# Patient Record
Sex: Female | Born: 1975 | Race: Black or African American | Hispanic: No | Marital: Single | Smoking: Never smoker
Health system: Southern US, Community
[De-identification: ages and names within clinical notes are randomized; demographics above are authoritative.]

## PROBLEM LIST (undated history)

## (undated) DIAGNOSIS — J45909 Unspecified asthma, uncomplicated: Secondary | ICD-10-CM

## (undated) HISTORY — PX: KNEE SURGERY: SHX244

---

## 1998-02-11 ENCOUNTER — Ambulatory Visit (HOSPITAL_COMMUNITY): Admission: RE | Admit: 1998-02-11 | Discharge: 1998-02-11 | Payer: Self-pay | Admitting: Obstetrics and Gynecology

## 1998-02-11 ENCOUNTER — Encounter: Payer: Self-pay | Admitting: Obstetrics and Gynecology

## 1998-02-25 ENCOUNTER — Other Ambulatory Visit: Admission: RE | Admit: 1998-02-25 | Discharge: 1998-02-25 | Payer: Self-pay | Admitting: Obstetrics and Gynecology

## 1999-06-23 ENCOUNTER — Other Ambulatory Visit: Admission: RE | Admit: 1999-06-23 | Discharge: 1999-06-23 | Payer: Self-pay | Admitting: Obstetrics and Gynecology

## 2002-05-04 ENCOUNTER — Other Ambulatory Visit: Admission: RE | Admit: 2002-05-04 | Discharge: 2002-05-04 | Payer: Self-pay | Admitting: *Deleted

## 2004-07-18 ENCOUNTER — Ambulatory Visit: Payer: Self-pay | Admitting: Internal Medicine

## 2004-07-23 ENCOUNTER — Ambulatory Visit: Payer: Self-pay | Admitting: Cardiology

## 2005-10-23 ENCOUNTER — Other Ambulatory Visit: Admission: RE | Admit: 2005-10-23 | Discharge: 2005-10-23 | Payer: Self-pay | Admitting: Family Medicine

## 2006-10-26 ENCOUNTER — Other Ambulatory Visit: Admission: RE | Admit: 2006-10-26 | Discharge: 2006-10-26 | Payer: Self-pay | Admitting: Family Medicine

## 2006-11-25 ENCOUNTER — Encounter: Admission: RE | Admit: 2006-11-25 | Discharge: 2007-02-23 | Payer: Self-pay | Admitting: Family Medicine

## 2010-04-23 ENCOUNTER — Other Ambulatory Visit (HOSPITAL_COMMUNITY)
Admission: RE | Admit: 2010-04-23 | Discharge: 2010-04-23 | Disposition: A | Discharge status: Home / Self Care | Payer: BC Managed Care – PPO | Source: Ambulatory Visit | Attending: Family Medicine | Admitting: Family Medicine

## 2010-04-23 ENCOUNTER — Other Ambulatory Visit: Payer: Self-pay | Admitting: Family Medicine

## 2010-04-23 DIAGNOSIS — Z124 Encounter for screening for malignant neoplasm of cervix: Secondary | ICD-10-CM | POA: Insufficient documentation

## 2012-09-12 ENCOUNTER — Other Ambulatory Visit (HOSPITAL_COMMUNITY)
Admission: RE | Admit: 2012-09-12 | Discharge: 2012-09-12 | Disposition: A | Discharge status: Home / Self Care | Payer: BC Managed Care – PPO | Source: Ambulatory Visit | Attending: Family Medicine | Admitting: Family Medicine

## 2012-09-12 ENCOUNTER — Other Ambulatory Visit: Payer: Self-pay | Admitting: Family Medicine

## 2012-09-12 DIAGNOSIS — Z1151 Encounter for screening for human papillomavirus (HPV): Secondary | ICD-10-CM | POA: Insufficient documentation

## 2012-09-12 DIAGNOSIS — Z124 Encounter for screening for malignant neoplasm of cervix: Secondary | ICD-10-CM | POA: Insufficient documentation

## 2015-09-26 ENCOUNTER — Other Ambulatory Visit: Payer: Self-pay | Admitting: Family Medicine

## 2015-09-26 ENCOUNTER — Other Ambulatory Visit (HOSPITAL_COMMUNITY)
Admission: RE | Admit: 2015-09-26 | Discharge: 2015-09-26 | Disposition: A | Discharge status: Home / Self Care | Payer: BC Managed Care – PPO | Source: Ambulatory Visit | Attending: Family Medicine | Admitting: Family Medicine

## 2015-09-26 DIAGNOSIS — Z113 Encounter for screening for infections with a predominantly sexual mode of transmission: Secondary | ICD-10-CM | POA: Diagnosis present

## 2015-09-26 DIAGNOSIS — Z1151 Encounter for screening for human papillomavirus (HPV): Secondary | ICD-10-CM | POA: Insufficient documentation

## 2015-09-26 DIAGNOSIS — Z124 Encounter for screening for malignant neoplasm of cervix: Secondary | ICD-10-CM | POA: Insufficient documentation

## 2015-09-27 LAB — CYTOLOGY - PAP

## 2016-06-24 ENCOUNTER — Other Ambulatory Visit: Payer: Self-pay | Admitting: Obstetrics and Gynecology

## 2019-05-25 ENCOUNTER — Encounter: Payer: Self-pay | Admitting: Physician Assistant

## 2019-05-25 ENCOUNTER — Ambulatory Visit: Payer: BC Managed Care – PPO | Admitting: Physician Assistant

## 2019-05-25 ENCOUNTER — Other Ambulatory Visit: Payer: Self-pay

## 2019-05-25 DIAGNOSIS — L739 Follicular disorder, unspecified: Secondary | ICD-10-CM | POA: Diagnosis not present

## 2019-05-25 MED ORDER — DOXYCYCLINE HYCLATE 100 MG PO CAPS: 100.0000 mg | ORAL_CAPSULE | Freq: Two times a day (BID) | ORAL | 2 refills | Status: AC

## 2019-05-25 MED ORDER — CLOBETASOL PROPIONATE 0.05 % EX SOLN: 1.0000 "application " | Freq: Two times a day (BID) | CUTANEOUS | 2 refills | Status: AC

## 2019-05-25 NOTE — Progress Notes (Signed)
   New Patient   Subjective  Sherry Mckee is an AA 44 y.o. female who presents for the following: Skin Problem (Patient has some breaking out of face. Started using a hair vitamin the mane choice in jan 2021. She used it a few months and then started breaking out. She stopped vitamin but now she says she cant use anthing on face except water without breaking out.  Also check patients scalp it itches so bad in the frontal area. Hair broke off in the itchy areas. No spots in scalp just red and itchy. Treatments were no more itch spray otc and then itch releif spray. Help some and patients hair is growing back. ).  The following portions of the chart were reviewed this encounter and updated as appropriate: Tobacco  Allergies  Meds  Problems  Med Hx  Surg Hx  Fam Hx      Objective  Well appearing patient in no apparent distress; mood and affect are within normal limits.  A focused examination was performed including scalp. Relevant physical exam findings are noted in the Assessment and Plan.  Objective  Mid Occipital Scalp (2), Mid Parietal Scalp (2), Right Occipital Scalp: Papules with PIH, nodules with loss of hair post occiput from picking  Assessment & Plan  Folliculitis (5) Right Occipital Scalp; Mid Parietal Scalp (2); Mid Occipital Scalp (2)  Bacterial culture Doxycycline , clobetasol cream  Other Related Procedures Anaerobic and Aerobic Culture

## 2019-05-30 ENCOUNTER — Telehealth: Payer: Self-pay

## 2019-05-30 NOTE — Telephone Encounter (Signed)
-----   Message from Kelli R Sheffield, PA-C sent at 05/30/2019  9:10 AM EDT ----- Please call to check status 

## 2019-05-30 NOTE — Telephone Encounter (Signed)
Phone call to patient with her culture results and to check on her per [facility] request.  Patient aware of culture results, patient states that she's doing much better and that the current treatment is helping her a great deal.  Mackey Birchwood aware.

## 2019-05-30 NOTE — Telephone Encounter (Signed)
-----   Message from Glyn Ade, [place] sent at 05/30/2019  9:10 AM EDT ----- Please call to check status

## 2019-06-01 LAB — ANAEROBIC AND AEROBIC CULTURE
MICRO NUMBER:: 10506022
MICRO NUMBER:: 10506023
SPECIMEN QUALITY:: ADEQUATE
SPECIMEN QUALITY:: ADEQUATE

## 2019-06-06 ENCOUNTER — Telehealth: Payer: Self-pay

## 2019-06-06 NOTE — Telephone Encounter (Signed)
-----   Message from Glyn Ade, [place] sent at 06/06/2019  9:56 AM EDT ----- Check status. Was given Doxy and clobetasol. Positive bacterial

## 2019-06-06 NOTE — Telephone Encounter (Signed)
Phone call to patient with her culture results.  Voicemail left for patient to give the office a call back. 

## 2019-06-06 NOTE — Telephone Encounter (Signed)
Called back, left message to call her @ [phone] or [phone]

## 2019-06-06 NOTE — Telephone Encounter (Signed)
Phone call from patient returning our call.  Patient states that she's doing much better.

## 2019-08-11 ENCOUNTER — Ambulatory Visit: Payer: BC Managed Care – PPO | Admitting: Physician Assistant

## 2019-08-31 ENCOUNTER — Other Ambulatory Visit: Payer: Self-pay | Admitting: Physician Assistant

## 2019-08-31 ENCOUNTER — Other Ambulatory Visit: Payer: Self-pay | Admitting: Obstetrics and Gynecology

## 2019-08-31 DIAGNOSIS — N63 Unspecified lump in unspecified breast: Secondary | ICD-10-CM

## 2019-09-06 ENCOUNTER — Telehealth: Payer: BC Managed Care – PPO | Admitting: Nurse Practitioner

## 2019-09-06 DIAGNOSIS — J029 Acute pharyngitis, unspecified: Secondary | ICD-10-CM | POA: Diagnosis not present

## 2019-09-06 NOTE — Progress Notes (Signed)
We are sorry that you are not feeling well.  Here is how we plan to help!  * THis is probably ot covid , due to having your vaccine. Right now it seems that you just have a virus ,and you will need to treat symptoms. If you develop body aches or a fever ver 101, you may need to be tested to be on the afe side.  Your symptoms indicate a likely viral infection (Pharyngitis).   Pharyngitis is inflammation in the back of the throat which can cause a sore throat, scratchiness and sometimes difficulty swallowing.   Pharyngitis is typically caused by a respiratory virus and will just run its course.  Please keep in mind that your symptoms could last up to 10 days.  For throat pain, we recommend over the counter oral pain relief medications such as acetaminophen or aspirin, or anti-inflammatory medications such as ibuprofen or naproxen sodium.  Topical treatments such as oral throat lozenges or sprays may be used as needed.  Avoid close contact with loved ones, especially the very young and elderly.  Remember to wash your hands thoroughly throughout the day as this is the number one way to prevent the spread of infection and wipe down door knobs and counters with disinfectant.  After careful review of your answers, I would not recommend and antibiotic for your condition.  Antibiotics should not be used to treat conditions that we suspect are caused by viruses like the virus that causes the common cold or flu. However, some people can have Strep with atypical symptoms. You may need formal testing in clinic or office to confirm if your symptoms continue or worsen.  Providers prescribe antibiotics to treat infections caused by bacteria. Antibiotics are very powerful in treating bacterial infections when they are used properly.  To maintain their effectiveness, they should be used only when necessary.  Overuse of antibiotics has resulted in the development of super bugs that are resistant to treatment!    Home  Care:  Only take medications as instructed by your medical team.  Do not drink alcohol while taking these medications.  A steam or ultrasonic humidifier can help congestion.  You can place a towel over your head and breathe in the steam from hot water coming from a faucet.  Avoid close contacts especially the very young and the elderly.  Cover your mouth when you cough or sneeze.  Always remember to wash your hands.  Get Help Right Away If:  You develop worsening fever or throat pain.  You develop a severe head ache or visual changes.  Your symptoms persist after you have completed your treatment plan.  Make sure you  Understand these instructions.  Will watch your condition.  Will get help right away if you are not doing well or get worse.  Your e-visit answers were reviewed by a board certified advanced clinical practitioner to complete your personal care plan.  Depending on the condition, your plan could have included both over the counter or prescription medications.  If there is a problem please reply  once you have received a response from your provider.  Your safety is important to [place].  If you have drug allergies check your prescription carefully.    You can use MyChart to ask questions about todays visit, request a non-urgent call back, or ask for a work or school excuse for 24 hours related to this e-Visit. If it has been greater than 24 hours you will need to follow up  with your provider, or enter a new e-Visit to address those concerns.  You will get an e-mail in the next two days asking about your experience.  I hope that your e-visit has been valuable and will speed your recovery. Thank you for using e-visits.  5-10 minutes spent reviewing and documenting in chart.

## 2019-09-07 ENCOUNTER — Other Ambulatory Visit: Payer: Self-pay | Admitting: Obstetrics and Gynecology

## 2019-09-07 DIAGNOSIS — N63 Unspecified lump in unspecified breast: Secondary | ICD-10-CM

## 2019-09-20 ENCOUNTER — Other Ambulatory Visit: Payer: Self-pay | Admitting: Obstetrics and Gynecology

## 2019-09-20 ENCOUNTER — Ambulatory Visit: Payer: BC Managed Care – PPO

## 2019-09-20 ENCOUNTER — Ambulatory Visit
Admission: RE | Admit: 2019-09-20 | Discharge: 2019-09-20 | Disposition: A | Discharge status: Home / Self Care | Payer: BC Managed Care – PPO | Source: Ambulatory Visit | Attending: Obstetrics and Gynecology | Admitting: Obstetrics and Gynecology

## 2019-09-20 ENCOUNTER — Other Ambulatory Visit: Payer: Self-pay

## 2019-09-20 DIAGNOSIS — N63 Unspecified lump in unspecified breast: Secondary | ICD-10-CM

## 2019-09-27 ENCOUNTER — Other Ambulatory Visit: Payer: Self-pay

## 2019-09-27 ENCOUNTER — Other Ambulatory Visit: Payer: Self-pay | Admitting: Obstetrics and Gynecology

## 2019-09-27 ENCOUNTER — Ambulatory Visit
Admission: RE | Admit: 2019-09-27 | Discharge: 2019-09-27 | Disposition: A | Discharge status: Home / Self Care | Payer: BC Managed Care – PPO | Source: Ambulatory Visit | Attending: Obstetrics and Gynecology | Admitting: Obstetrics and Gynecology

## 2019-09-27 DIAGNOSIS — N63 Unspecified lump in unspecified breast: Secondary | ICD-10-CM

## 2019-12-13 ENCOUNTER — Encounter (HOSPITAL_BASED_OUTPATIENT_CLINIC_OR_DEPARTMENT_OTHER): Payer: Self-pay | Admitting: *Deleted

## 2019-12-13 ENCOUNTER — Other Ambulatory Visit: Payer: Self-pay

## 2019-12-13 ENCOUNTER — Emergency Department (HOSPITAL_BASED_OUTPATIENT_CLINIC_OR_DEPARTMENT_OTHER)
Admission: EM | Admit: 2019-12-13 | Discharge: 2019-12-13 | Disposition: A | Discharge status: Home / Self Care | Payer: BC Managed Care – PPO | Attending: Emergency Medicine | Admitting: Emergency Medicine

## 2019-12-13 DIAGNOSIS — R0981 Nasal congestion: Secondary | ICD-10-CM | POA: Insufficient documentation

## 2019-12-13 DIAGNOSIS — R0609 Other forms of dyspnea: Secondary | ICD-10-CM | POA: Diagnosis not present

## 2019-12-13 DIAGNOSIS — J45909 Unspecified asthma, uncomplicated: Secondary | ICD-10-CM | POA: Insufficient documentation

## 2019-12-13 HISTORY — DX: Unspecified asthma, uncomplicated: J45.909

## 2019-12-13 NOTE — ED Provider Notes (Signed)
[facility] EMERGENCY DEPARTMENT Provider Note   CSN: [id number] Arrival date & time: 12/13/19  1831     History Chief Complaint  Patient presents with  . ?Anxiety    Sherry Mckee is a 44 y.o. female with history of GERD.  Patient with a chief complaint of episodic moments where she cannot catch her breath.  Patient reports that these episodes started last month, they only occur while she is at work standing and wearing a mask, episodes only last 1 to 2 seconds.  Patient reports that when the symptoms started last month she was having a lot of nasal congestion and sinus pressure however those symptoms have resolved.  Patient reports that she has had increased stress lately to her job over the last year and multiple deadlines to meet last month.  Reports that her mother and siblings have had PEs before.  Patient denies any chest pain, pleuritic chest pain, shortness of breath, hemoptysis, calf or leg swelling, malignancy treatment within the last 6 months, surgery in the last 4 weeks, previous PE or DVT.  Patient states that yesterday she was able to go to the gym and perform a cardio workout without any difficulty or shortness of breath.  She does report taking estrogen for uterine fibroids.   HPI     Past Medical History:  Diagnosis Date  . Asthma     There are no problems to display for this patient.   Past Surgical History:  Procedure Laterality Date  . KNEE SURGERY     right     OB History   No obstetric history on file.     History reviewed. No pertinent family history.  Social History   Tobacco Use  . Smoking status: Never Smoker  . Smokeless tobacco: Never Used  Substance Use Topics  . Alcohol use: Never  . Drug use: Never    Home Medications Prior to Admission medications   Medication Sig Start Date End Date Taking? Authorizing Provider  clobetasol (TEMOVATE) 0.05 % external solution Apply 1 application topically 2 (two) times daily.  05/25/19   Sheffield, Kelli R, PA-C  LO LOESTRIN FE 1 MG-10 MCG / 10 MCG tablet Take 1 tablet by mouth daily. 03/03/19   [provider]    Allergies    Patient has no known allergies.  Review of Systems   Review of Systems  Constitutional: Negative for chills and fever.  HENT: Negative for congestion and sore throat.   Eyes: Negative for visual disturbance.  Respiratory: Positive for cough (intermittent). Negative for shortness of breath and wheezing.   Cardiovascular: Negative for chest pain, palpitations and leg swelling.  Gastrointestinal: Negative for abdominal pain, nausea and vomiting.  Genitourinary: Negative for difficulty urinating and dysuria.  Musculoskeletal: Negative for back pain and neck pain.  Skin: Negative for color change and rash.  Neurological: Negative for dizziness, syncope, light-headedness and headaches.  Psychiatric/Behavioral: Negative for confusion.    Physical Exam Updated Vital Signs BP 130/78   Pulse 80   Temp 98 F (36.7 C)   Resp 18   Ht 5\' 9"  (1.753 m)   Wt 115.7 kg   SpO2 100%   BMI 37.66 kg/m   Physical Exam Constitutional:      General: She is not in acute distress.    Appearance: She is obese. She is not ill-appearing, toxic-appearing or diaphoretic.  HENT:     Head: Normocephalic.     Nose: No congestion or rhinorrhea.  Eyes:  Pupils: Pupils are equal, round, and reactive to light.  Cardiovascular:     Rate and Rhythm: Normal rate and regular rhythm.     Heart sounds: Normal heart sounds.  Pulmonary:     Effort: Pulmonary effort is normal. No tachypnea, bradypnea, accessory muscle usage or respiratory distress.     Breath sounds: Normal breath sounds. No stridor. No wheezing, rhonchi or rales.  Abdominal:     Palpations: Abdomen is soft.     Tenderness: There is no abdominal tenderness.  Musculoskeletal:     Right lower leg: No swelling or tenderness. No edema.     Left lower leg: No swelling or tenderness. No  edema.  Skin:    General: Skin is warm and dry.  Neurological:     General: No focal deficit present.     Mental Status: She is alert.  Psychiatric:        Behavior: Behavior is cooperative.     ED Results / Procedures / Treatments   Labs (all labs ordered are listed, but only abnormal results are displayed) Labs Reviewed - No data to display  EKG None  Radiology No results found.  Procedures Procedures (including critical care time)  Medications Ordered in ED Medications - No data to display  ED Course  I have reviewed the triage vital signs and the nursing notes.  Pertinent labs & imaging results that were available during my care of the patient were reviewed by me and considered in my medical decision making (see chart for details).    MDM Rules/Calculators/A&P                          Alert 44 year old female in no acute distress complains of episodic moments at work where she cannot take a full breath.  Lungs clear to auscultation in all fields bilaterally.  Patient able to speak in full complete sentences without any difficulty.  O2 sats 100% on RA while ambulating.  Vital signs stable.  Low concern for , pulmonary edema, bronchoconstriction, Pneumonia, Pulmonary embolism, Pneumotherax/ Hemothorax.  Patient was given reassurance and told to follow-up with her primary care provider if symptoms persist.   Final Clinical Impression(s) / ED Diagnoses Final diagnoses:  Other form of dyspnea    Rx / DC Orders ED Discharge Orders    None       Berneice Heinrich 12/13/19 2137    Pollyann Savoy, MD 12/13/19 2225

## 2019-12-13 NOTE — ED Notes (Signed)
Pt O2 sats 100% on RA while ambulating.

## 2019-12-13 NOTE — ED Triage Notes (Addendum)
Pt has difficulty breathing for a short period of time while she is awake.  Has been going on since November.

## 2019-12-13 NOTE — Discharge Instructions (Addendum)
You came to the emergency department today to have your episodes where you cannot catch your breath.  Your physical exam and vital signs were reassuring and there were no indications for further testing at this time.  If your symptoms persist please follow-up with your primary care doctor.    Get help right away if: Your shortness of breath gets worse. You have shortness of breath when you are resting. You feel light-headed or you faint. You have a cough that is not controlled with medicines. You cough up blood. You have pain with breathing. You have pain in your chest, arms, shoulders, or abdomen. You have a fever. You cannot walk up stairs or exercise the way that you normally do.

## 2020-01-09 ENCOUNTER — Encounter: Payer: Self-pay | Admitting: Physician Assistant

## 2020-01-09 ENCOUNTER — Other Ambulatory Visit: Payer: Self-pay

## 2020-01-09 ENCOUNTER — Ambulatory Visit: Payer: BC Managed Care – PPO | Admitting: Physician Assistant

## 2020-01-09 DIAGNOSIS — L739 Follicular disorder, unspecified: Secondary | ICD-10-CM | POA: Diagnosis not present

## 2020-01-09 DIAGNOSIS — L659 Nonscarring hair loss, unspecified: Secondary | ICD-10-CM | POA: Diagnosis not present

## 2020-01-09 NOTE — Progress Notes (Signed)
   Follow-Up Visit   Subjective  Sherry Mckee is a 45 y.o. female who presents for the following: Follow-up (Patient here today for follow up form folliculitis.  Per patient she's seen improvement with the Doxy and Clobetasol Solution.  Per patient she is noticing hair grown and not as inflamed.).   The following portions of the chart were reviewed this encounter and updated as appropriate:  Tobacco  Allergies  Meds  Problems  Med Hx  Surg Hx  Fam Hx      Objective  Well appearing patient in no apparent distress; mood and affect are within normal limits.  A focused examination was performed including scalp and face. Relevant physical exam findings are noted in the Assessment and Plan.  Objective  scalp: Hair has improved with topical clobetasol and treatment of staph induced folliculitis  Objective  post occiput: Slightly increased brown color. No active papules or pustules   Assessment & Plan  Alopecia scalp  Continue clobetasol as needed. Start rogaine 5% as tolerated.  Folliculitis post occiput Post inflammatory hyperpigmentation Contnue topical clobetasol.    I, Sevyn Paredez, PA-C, have reviewed all documentation's for this visit.  The documentation on 01/09/20 for the exam, diagnosis, procedures and orders are all accurate and complete.

## 2020-05-07 ENCOUNTER — Other Ambulatory Visit: Payer: Self-pay | Admitting: Family Medicine

## 2020-05-07 ENCOUNTER — Other Ambulatory Visit: Payer: Self-pay | Admitting: Obstetrics and Gynecology

## 2020-05-07 ENCOUNTER — Other Ambulatory Visit: Payer: Self-pay | Admitting: *Deleted

## 2020-05-07 DIAGNOSIS — R928 Other abnormal and inconclusive findings on diagnostic imaging of breast: Secondary | ICD-10-CM

## 2020-05-10 ENCOUNTER — Ambulatory Visit
Admission: RE | Admit: 2020-05-10 | Discharge: 2020-05-10 | Disposition: A | Discharge status: Home / Self Care | Payer: BC Managed Care – PPO | Source: Ambulatory Visit | Attending: Obstetrics and Gynecology | Admitting: Obstetrics and Gynecology

## 2020-05-10 ENCOUNTER — Other Ambulatory Visit: Payer: Self-pay

## 2020-05-10 DIAGNOSIS — R928 Other abnormal and inconclusive findings on diagnostic imaging of breast: Secondary | ICD-10-CM

## 2022-02-12 ENCOUNTER — Other Ambulatory Visit (HOSPITAL_COMMUNITY): Payer: Self-pay | Admitting: Family Medicine

## 2022-02-12 DIAGNOSIS — Z8249 Family history of ischemic heart disease and other diseases of the circulatory system: Secondary | ICD-10-CM

## 2022-03-03 ENCOUNTER — Ambulatory Visit (HOSPITAL_COMMUNITY)
Admission: RE | Admit: 2022-03-03 | Discharge: 2022-03-03 | Disposition: A | Discharge status: Home / Self Care | Payer: BC Managed Care – PPO | Source: Ambulatory Visit | Attending: Family Medicine | Admitting: Family Medicine

## 2022-03-03 DIAGNOSIS — Z8249 Family history of ischemic heart disease and other diseases of the circulatory system: Secondary | ICD-10-CM

## 2022-03-05 IMAGING — US US BREAST*L* LIMITED INC AXILLA
1 series · 13 of 15 positions shown · non-contrast
Comparison: Previous exam(s).

CLINICAL DATA: Follow-up probably benign fibrocystic changes in the
11:30 o'clock position of the left breast.

EXAM:
DIGITAL DIAGNOSTIC UNILATERAL LEFT MAMMOGRAM WITH TOMOSYNTHESIS AND
CAD; ULTRASOUND LEFT BREAST LIMITED
TECHNIQUE: Left digital diagnostic mammography and breast tomosynthesis was
performed. The images were evaluated with computer-aided detection.;
Targeted ultrasound examination of the left breast was performed

[Series 1: us breast*left* limited inc axilla · 0.07mm/px · 13 of 15 slices shown]
[im 1/15]
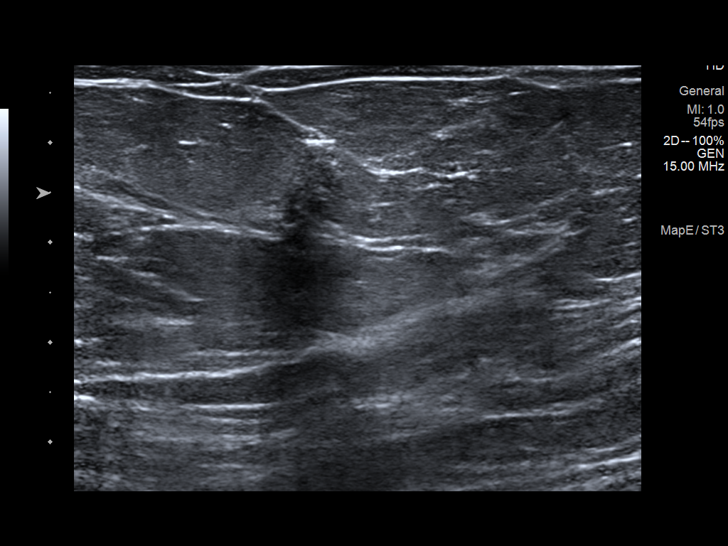
[im 2/15]
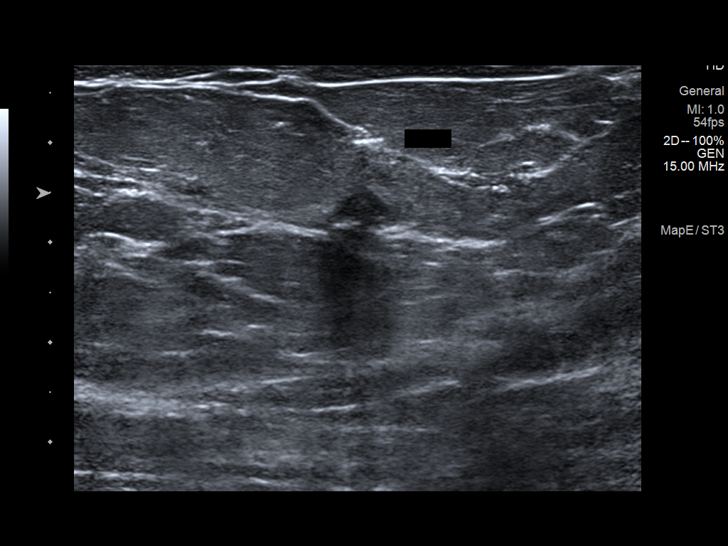
[im 3/15]
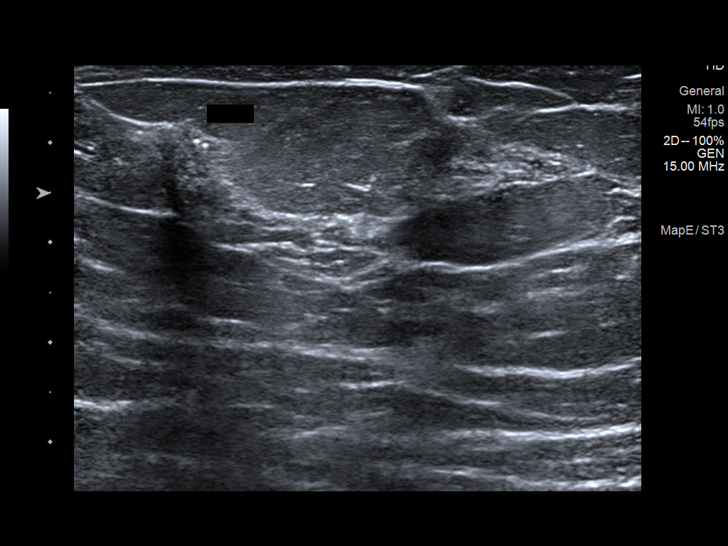
[im 5/15]
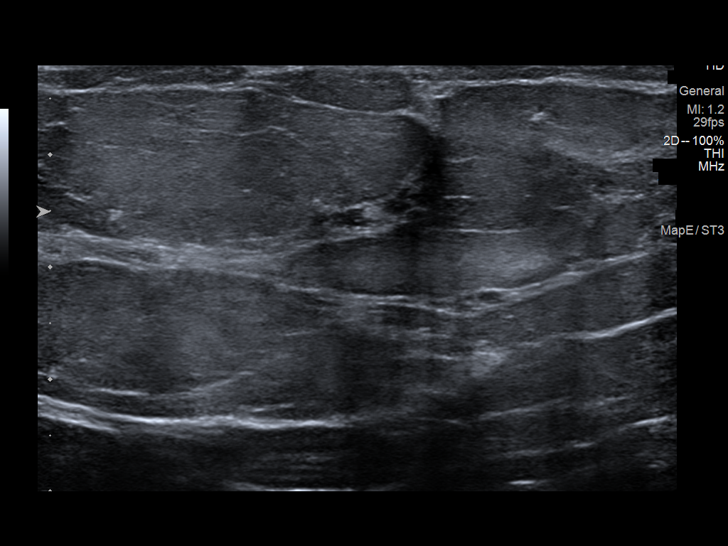
[im 6/15]
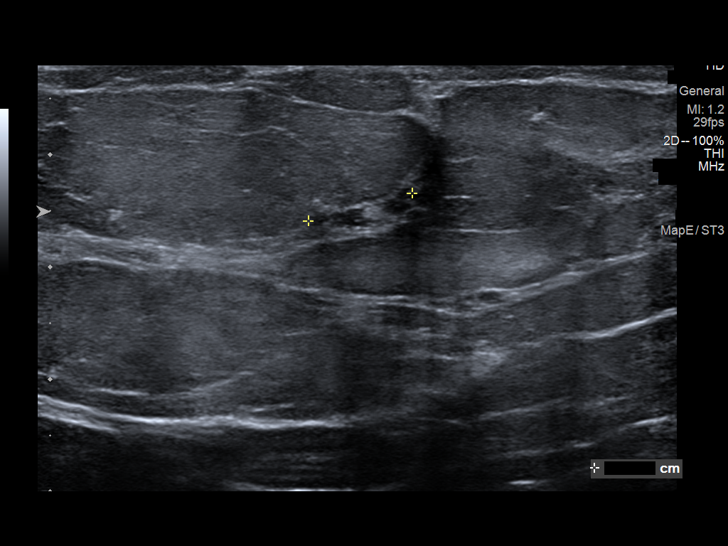
[im 7/15]
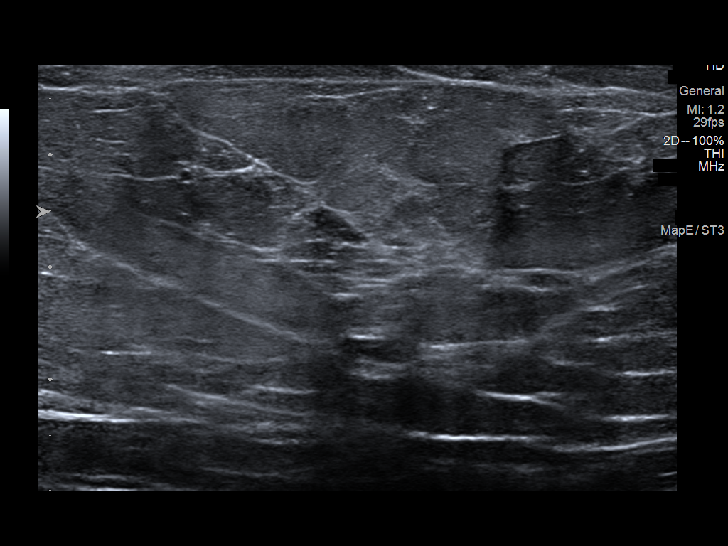
[im 8/15]
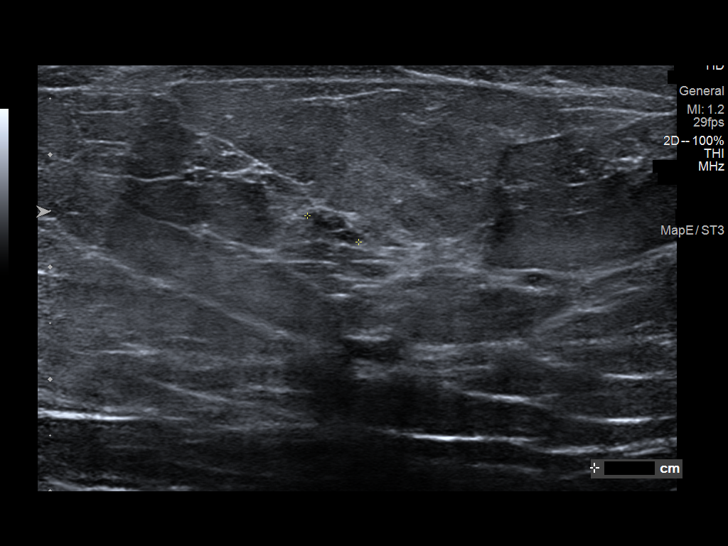
[im 9/15]
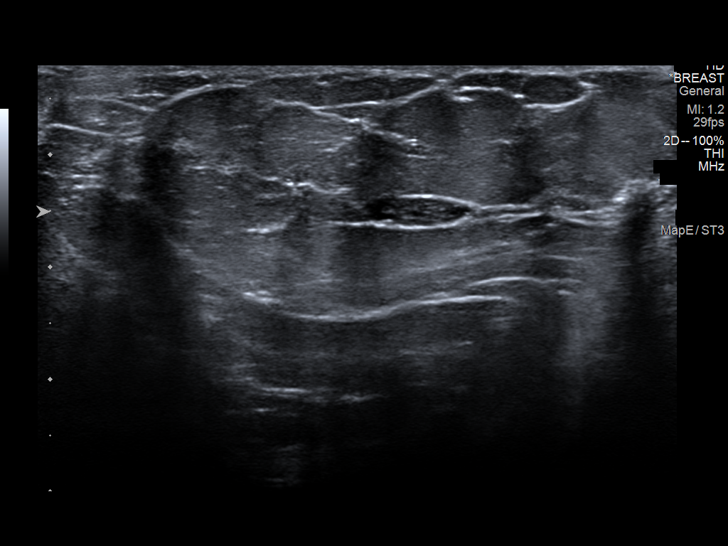
[im 10/15]
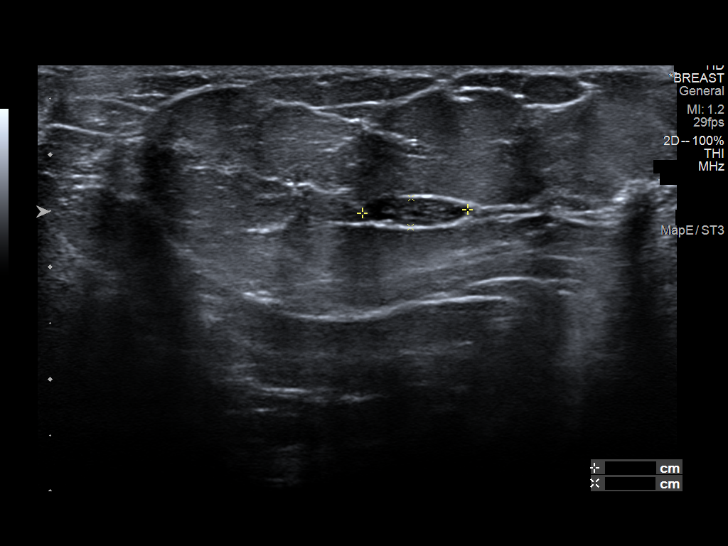
[im 11/15]
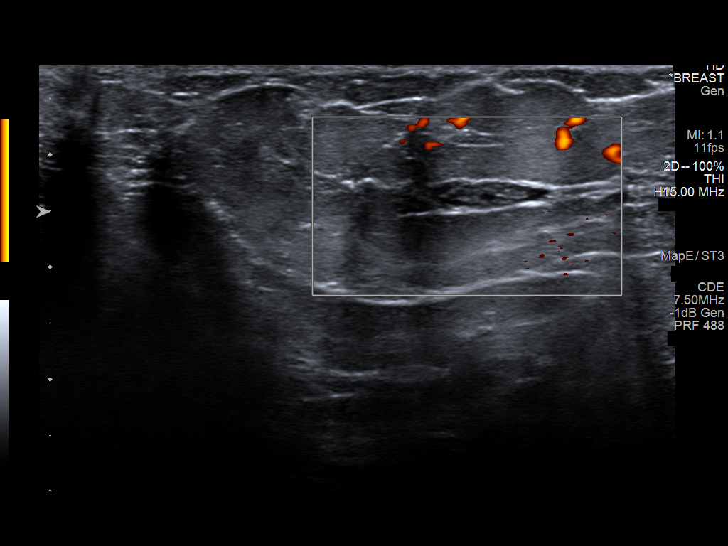
[im 13/15]
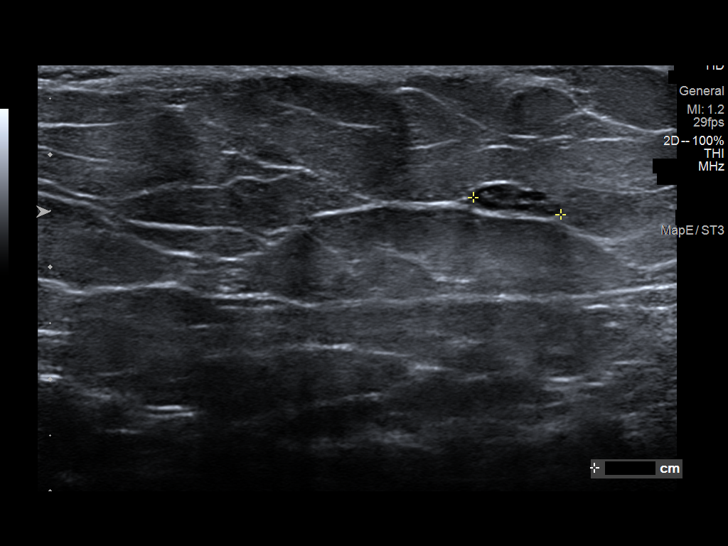
[im 14/15]
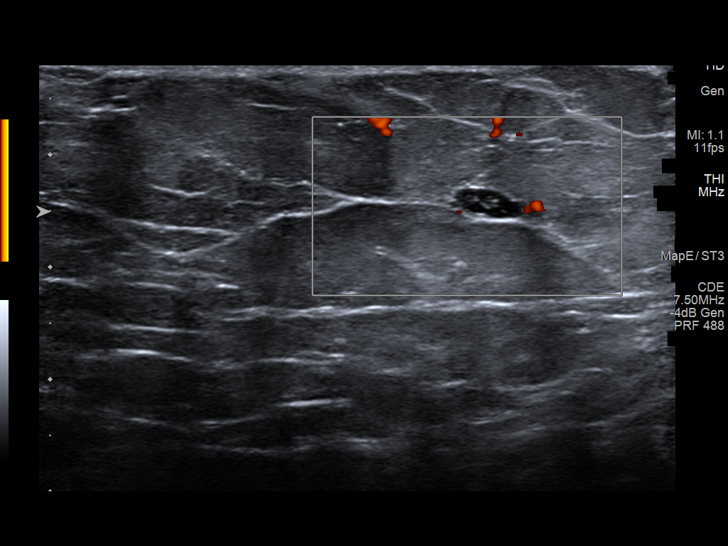
[im 15/15]
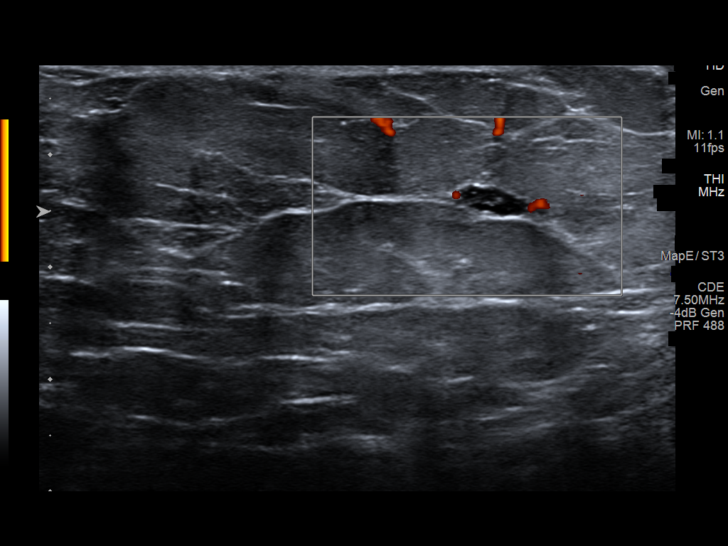

[13 of 15 positions shown; findings below may reference images not displayed]

ACR Breast Density Category b: There are scattered areas of
fibroglandular density.
FINDINGS: Interval oval, circumscribed mass in the 6 o'clock position of the
left breast. A small, oval circumscribed mass in the lower inner
left breast has not changed significantly. The remainder of the
breast is unchanged.

On physical exam, no mass is palpable in the upper or lower left
breast.

Targeted ultrasound is performed, showing multiple small areas of
hypoechoic tissue interspersed with echogenic tissue throughout the
upper and inferior left breast. The previously seen area in the
11:30 o'clock position, 9 cm from the nipple, is grossly unchanged
at real-time. It is difficult to determine which of the multiple
areas corresponds to that area on the previous images.

There is also an oval, horizontally oriented, circumscribed, cluster
of cysts in the 6 o'clock position of the breast, 2 cm from the
nipple. This measures 9 x 5 x 3 mm and corresponds to the
mammographic mass.
IMPRESSION: Benign left breast fibrocystic changes. No further follow-up is
recommended.

RECOMMENDATION:
Bilateral screening mammogram in 2 months when due. That will be 1
year since mammographic evaluation of the right breast.

I have discussed the findings and recommendations with the patient.
If applicable, a reminder letter will be sent to the patient
regarding the next appointment.

BI-RADS CATEGORY  2: Benign.

## 2022-03-05 IMAGING — MG MM DIGITAL DIAGNOSTIC UNILAT*L* W/ TOMO W/ CAD
8 series · 8 of 24 positions shown · non-contrast
Comparison: Previous exam(s).

CLINICAL DATA: Follow-up probably benign fibrocystic changes in the
11:30 o'clock position of the left breast.

EXAM:
DIGITAL DIAGNOSTIC UNILATERAL LEFT MAMMOGRAM WITH TOMOSYNTHESIS AND
CAD; ULTRASOUND LEFT BREAST LIMITED
TECHNIQUE: Left digital diagnostic mammography and breast tomosynthesis was
performed. The images were evaluated with computer-aided detection.;
Targeted ultrasound examination of the left breast was performed

[L CC synth-2D (1 of 2)]
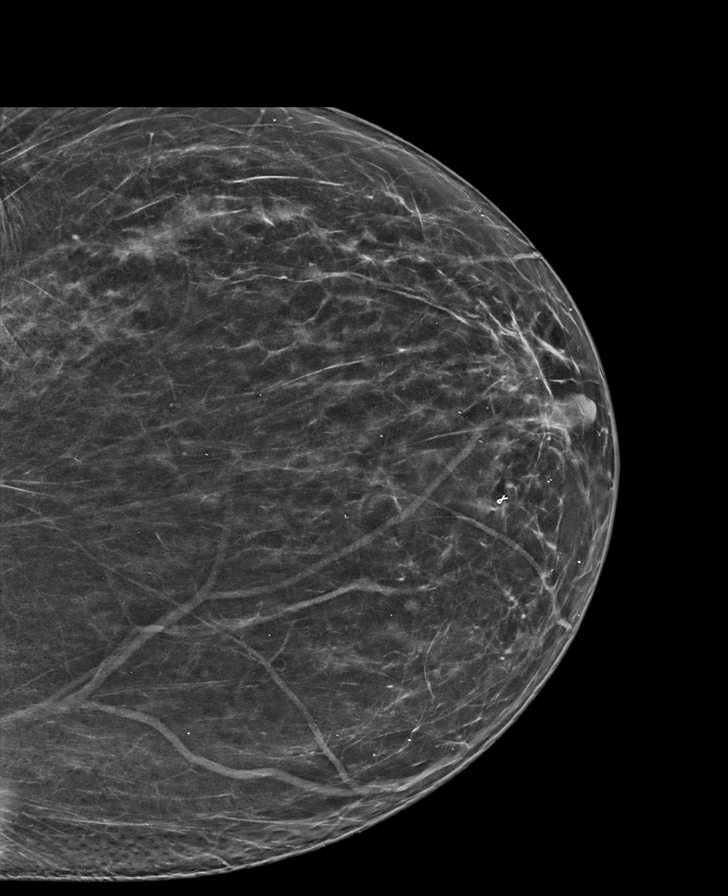

[L CC synth-2D (2 of 2)]
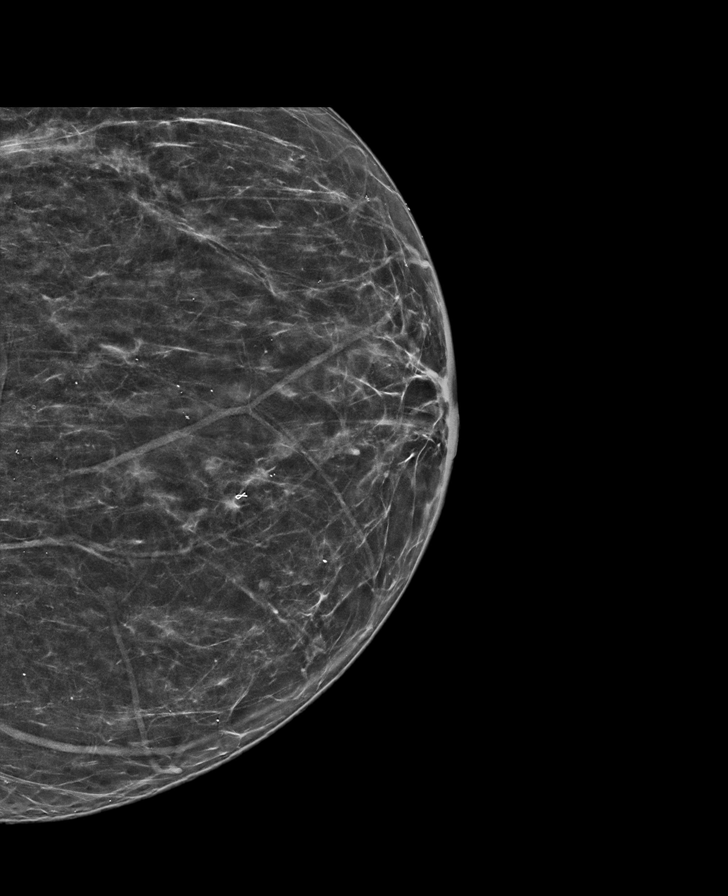

[L MLO synth-2D (1 of 2)]
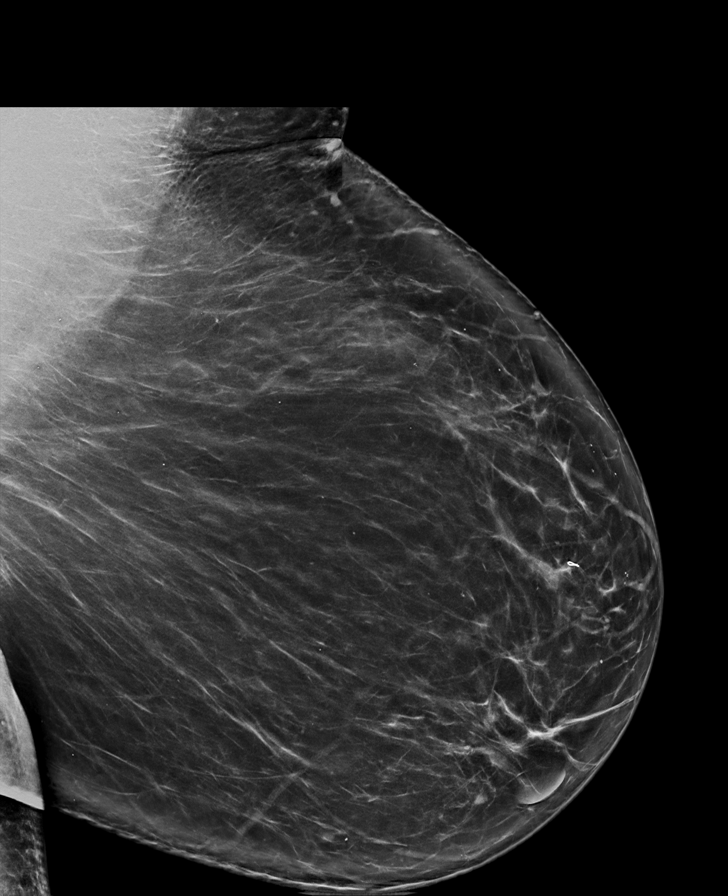

[L MLO synth-2D (2 of 2)]
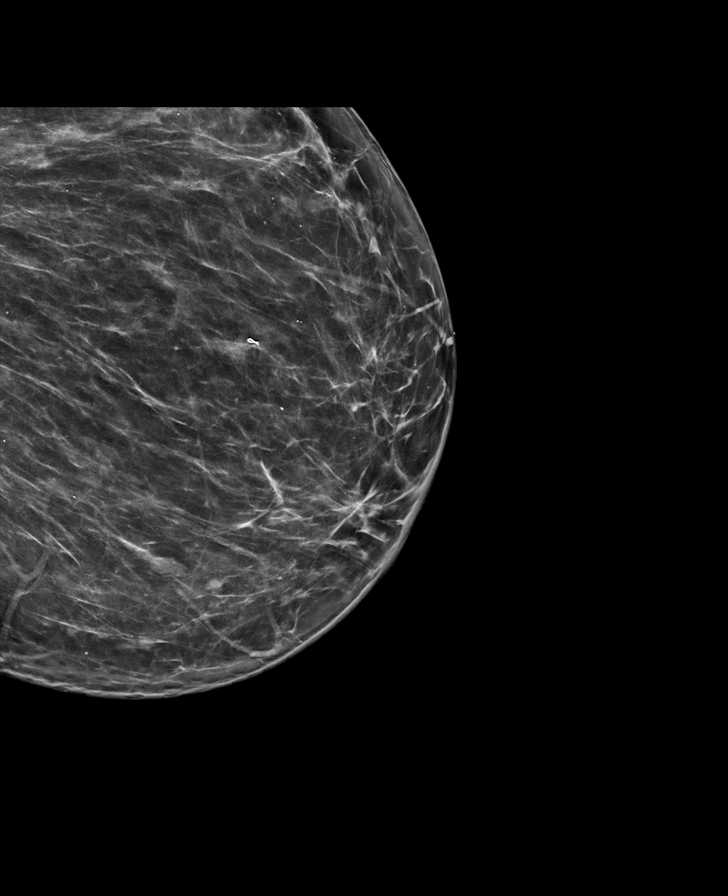

[L MLO tomo (1 of 2) · tomo slice 49/98.0]
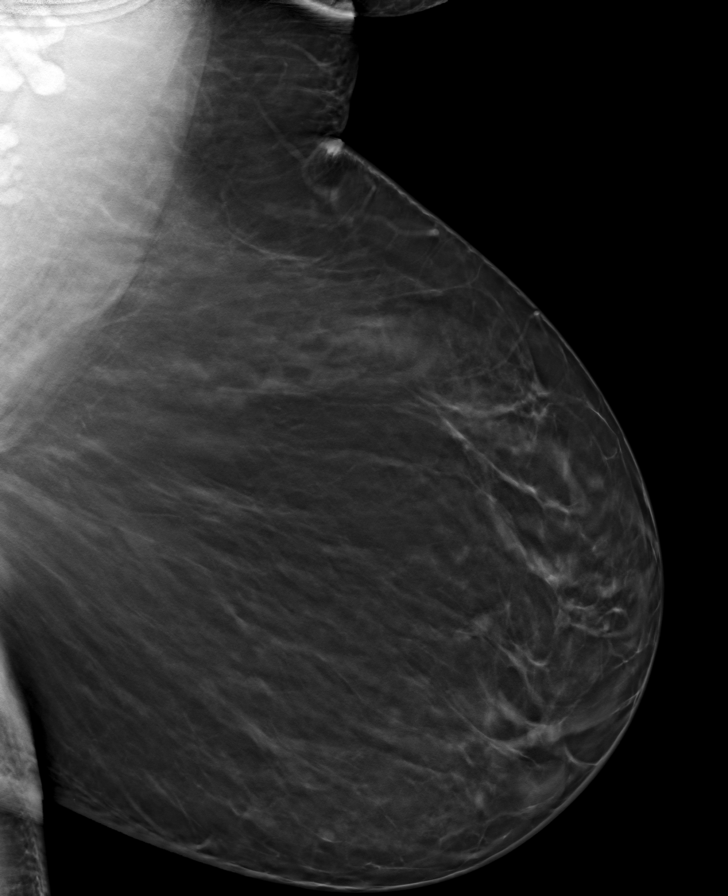

[L CC tomo (1 of 2) · tomo slice 39/78.0]
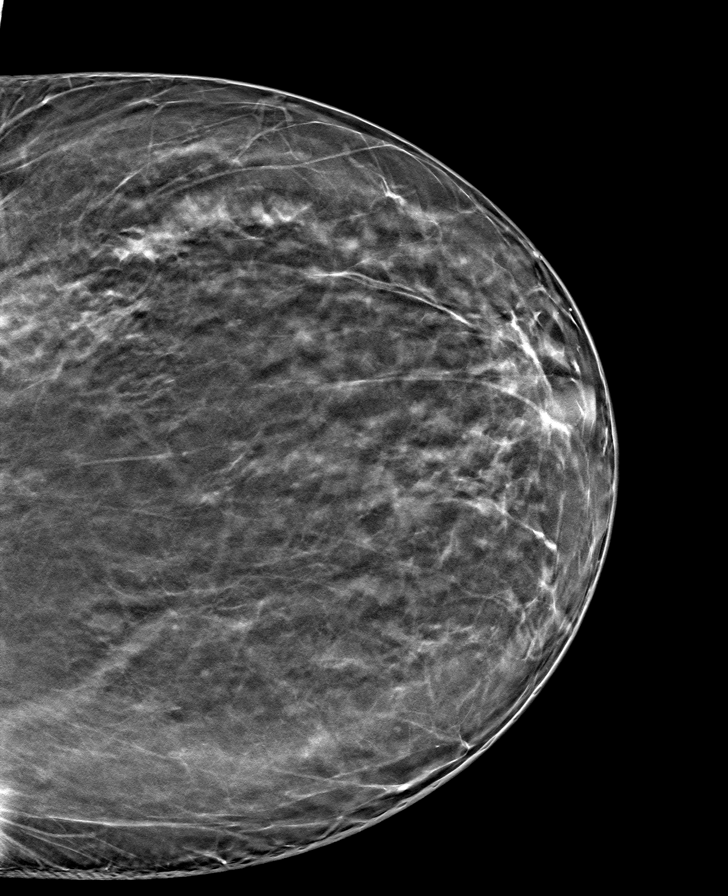

[L CC tomo (2 of 2) · tomo slice 37/72.0]
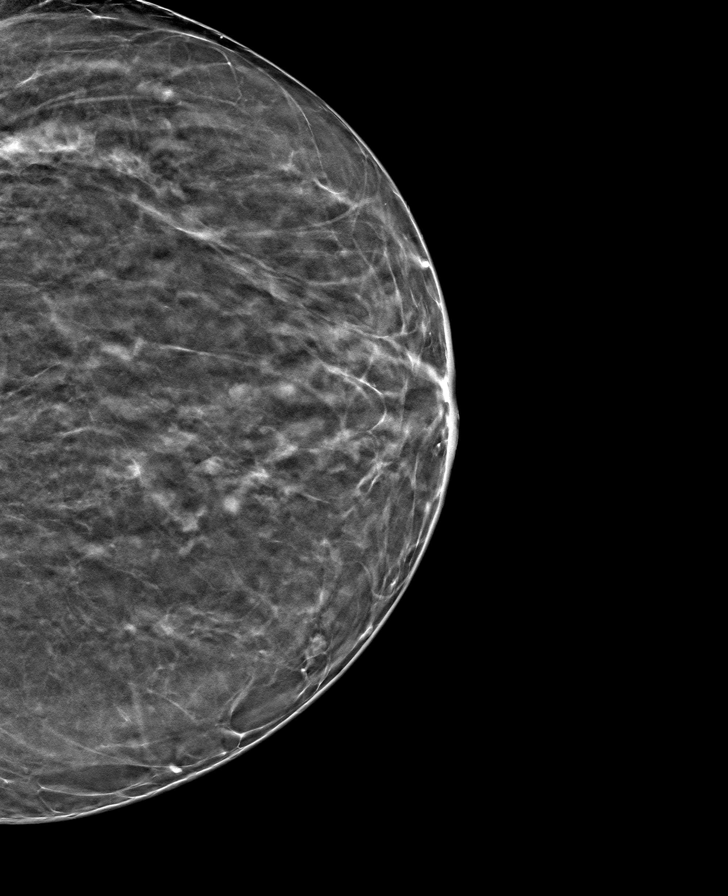

[L MLO tomo (2 of 2) · tomo slice 42/83.0]
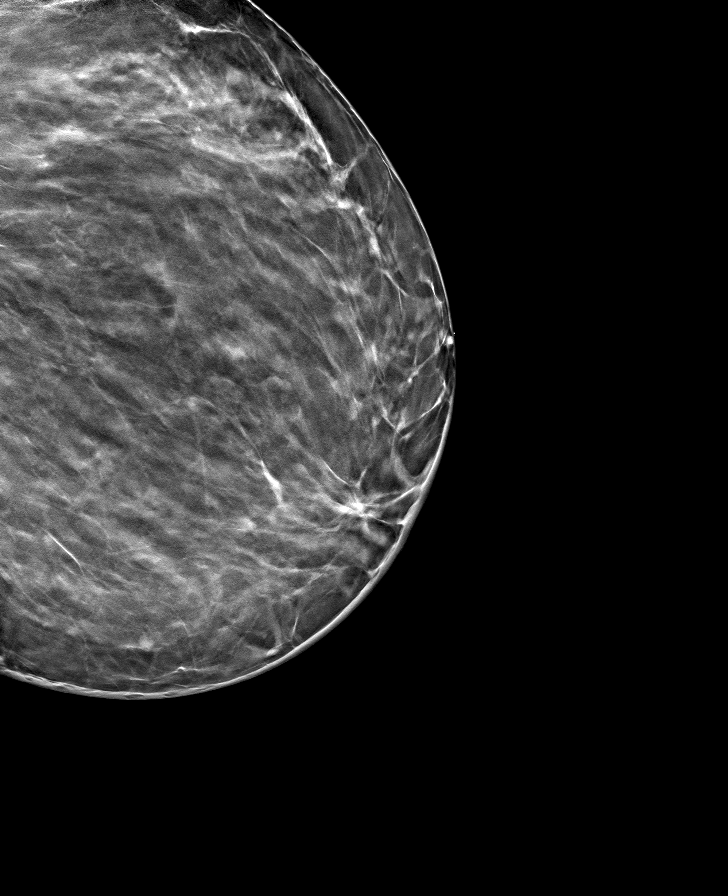

[8 of 24 positions shown; findings below may reference images not displayed]

ACR Breast Density Category b: There are scattered areas of
fibroglandular density.
FINDINGS: Interval oval, circumscribed mass in the 6 o'clock position of the
left breast. A small, oval circumscribed mass in the lower inner
left breast has not changed significantly. The remainder of the
breast is unchanged.

On physical exam, no mass is palpable in the upper or lower left
breast.

Targeted ultrasound is performed, showing multiple small areas of
hypoechoic tissue interspersed with echogenic tissue throughout the
upper and inferior left breast. The previously seen area in the
11:30 o'clock position, 9 cm from the nipple, is grossly unchanged
at real-time. It is difficult to determine which of the multiple
areas corresponds to that area on the previous images.

There is also an oval, horizontally oriented, circumscribed, cluster
of cysts in the 6 o'clock position of the breast, 2 cm from the
nipple. This measures 9 x 5 x 3 mm and corresponds to the
mammographic mass.
IMPRESSION: Benign left breast fibrocystic changes. No further follow-up is
recommended.

RECOMMENDATION:
Bilateral screening mammogram in 2 months when due. That will be 1
year since mammographic evaluation of the right breast.

I have discussed the findings and recommendations with the patient.
If applicable, a reminder letter will be sent to the patient
regarding the next appointment.

BI-RADS CATEGORY  2: Benign.
